# Patient Record
Sex: Female | Born: 1955 | Race: Black or African American | Hispanic: No | Marital: Married | Smoking: Never smoker
Health system: Southern US, Community
[De-identification: ages and names within clinical notes are randomized; demographics above are authoritative.]

## PROBLEM LIST (undated history)

## (undated) DIAGNOSIS — D369 Benign neoplasm, unspecified site: Secondary | ICD-10-CM

## (undated) DIAGNOSIS — A6000 Herpesviral infection of urogenital system, unspecified: Secondary | ICD-10-CM

## (undated) HISTORY — PX: TUBAL LIGATION: SHX77

---

## 2005-06-16 ENCOUNTER — Ambulatory Visit: Payer: Self-pay | Admitting: Family Medicine

## 2006-06-22 ENCOUNTER — Ambulatory Visit: Payer: Self-pay | Admitting: Family Medicine

## 2006-06-23 ENCOUNTER — Ambulatory Visit: Payer: Self-pay | Admitting: Gastroenterology

## 2008-01-20 HISTORY — PX: BREAST BIOPSY: SHX20

## 2008-03-14 ENCOUNTER — Ambulatory Visit: Payer: Self-pay | Admitting: Family Medicine

## 2008-04-17 ENCOUNTER — Ambulatory Visit: Payer: Self-pay | Admitting: Surgery

## 2010-06-20 ENCOUNTER — Ambulatory Visit: Payer: Self-pay | Admitting: Family Medicine

## 2011-06-30 ENCOUNTER — Ambulatory Visit: Payer: Self-pay | Admitting: Family Medicine

## 2012-06-30 ENCOUNTER — Ambulatory Visit: Payer: Self-pay | Admitting: Family Medicine

## 2013-08-22 ENCOUNTER — Ambulatory Visit: Payer: Self-pay | Admitting: Family Medicine

## 2014-10-23 ENCOUNTER — Other Ambulatory Visit: Payer: Self-pay | Admitting: Family Medicine

## 2014-10-23 DIAGNOSIS — Z1231 Encounter for screening mammogram for malignant neoplasm of breast: Secondary | ICD-10-CM

## 2014-11-08 ENCOUNTER — Ambulatory Visit
Admission: RE | Admit: 2014-11-08 | Discharge: 2014-11-08 | Disposition: A | Discharge status: Home / Self Care | Payer: BLUE CROSS/BLUE SHIELD | Source: Ambulatory Visit | Attending: Family Medicine | Admitting: Family Medicine

## 2014-11-08 DIAGNOSIS — Z1231 Encounter for screening mammogram for malignant neoplasm of breast: Secondary | ICD-10-CM | POA: Insufficient documentation

## 2014-12-17 ENCOUNTER — Encounter: Payer: Self-pay | Admitting: *Deleted

## 2014-12-18 ENCOUNTER — Encounter
Admission: RE | Disposition: A | Discharge status: Home / Self Care | Payer: Self-pay | Source: Ambulatory Visit | Attending: Gastroenterology

## 2014-12-18 ENCOUNTER — Encounter: Payer: Self-pay | Admitting: *Deleted

## 2014-12-18 ENCOUNTER — Ambulatory Visit
Admission: RE | Admit: 2014-12-18 | Discharge: 2014-12-18 | Disposition: A | Discharge status: Home / Self Care | Payer: BLUE CROSS/BLUE SHIELD | Source: Ambulatory Visit | Attending: Gastroenterology | Admitting: Gastroenterology

## 2014-12-18 ENCOUNTER — Ambulatory Visit: Payer: BLUE CROSS/BLUE SHIELD | Admitting: Anesthesiology

## 2014-12-18 DIAGNOSIS — Z800 Family history of malignant neoplasm of digestive organs: Secondary | ICD-10-CM | POA: Insufficient documentation

## 2014-12-18 DIAGNOSIS — D123 Benign neoplasm of transverse colon: Secondary | ICD-10-CM | POA: Diagnosis not present

## 2014-12-18 DIAGNOSIS — Z86018 Personal history of other benign neoplasm: Secondary | ICD-10-CM | POA: Diagnosis not present

## 2014-12-18 DIAGNOSIS — K648 Other hemorrhoids: Secondary | ICD-10-CM | POA: Insufficient documentation

## 2014-12-18 DIAGNOSIS — Z1211 Encounter for screening for malignant neoplasm of colon: Secondary | ICD-10-CM | POA: Insufficient documentation

## 2014-12-18 DIAGNOSIS — K5730 Diverticulosis of large intestine without perforation or abscess without bleeding: Secondary | ICD-10-CM | POA: Insufficient documentation

## 2014-12-18 DIAGNOSIS — D125 Benign neoplasm of sigmoid colon: Secondary | ICD-10-CM | POA: Insufficient documentation

## 2014-12-18 HISTORY — DX: Benign neoplasm, unspecified site: D36.9

## 2014-12-18 HISTORY — DX: Herpesviral infection of urogenital system, unspecified: A60.00

## 2014-12-18 HISTORY — PX: COLONOSCOPY WITH PROPOFOL: SHX5780

## 2014-12-18 SURGERY — COLONOSCOPY WITH PROPOFOL
Anesthesia: General

## 2014-12-18 MED ORDER — MIDAZOLAM HCL 5 MG/5ML IJ SOLN
INTRAMUSCULAR | Status: DC | PRN
  Administered 2014-12-18: 1 mg via INTRAVENOUS

## 2014-12-18 MED ORDER — PROPOFOL 10 MG/ML IV BOLUS
INTRAVENOUS | Status: DC | PRN
  Administered 2014-12-18: 60 mg via INTRAVENOUS

## 2014-12-18 MED ORDER — SODIUM CHLORIDE 0.9 % IV SOLN: INTRAVENOUS | Status: DC

## 2014-12-18 MED ORDER — LIDOCAINE HCL (CARDIAC) 20 MG/ML IV SOLN
INTRAVENOUS | Status: DC | PRN
Start: 2014-12-18 — End: 2014-12-18
  Administered 2014-12-18: 100 mg via INTRAVENOUS

## 2014-12-18 MED ORDER — FENTANYL CITRATE (PF) 100 MCG/2ML IJ SOLN
INTRAMUSCULAR | Status: DC | PRN
  Administered 2014-12-18: 50 ug via INTRAVENOUS

## 2014-12-18 MED ORDER — SODIUM CHLORIDE 0.9 % IV SOLN
INTRAVENOUS | Status: DC
  Administered 2014-12-18: 13:00:00 via INTRAVENOUS

## 2014-12-18 MED ORDER — PROPOFOL 500 MG/50ML IV EMUL
INTRAVENOUS | Status: DC | PRN
  Administered 2014-12-18: 120 ug/kg/min via INTRAVENOUS

## 2014-12-18 NOTE — Anesthesia Preprocedure Evaluation (Signed)
Anesthesia Evaluation  Patient identified by MRN, date of birth, ID band Patient awake    Reviewed: Allergy & Precautions, NPO status , Patient's Chart, lab work & pertinent test results  Airway Mallampati: II       Dental no notable dental hx. (+) Teeth Intact   Pulmonary neg pulmonary ROS,    Pulmonary exam normal        Cardiovascular Exercise Tolerance: Good negative cardio ROS   Rhythm:Regular Rate:Normal     Neuro/Psych    GI/Hepatic negative GI ROS, Neg liver ROS,   Endo/Other    Renal/GU negative Renal ROS     Musculoskeletal negative musculoskeletal ROS (+)   Abdominal Normal abdominal exam  (+)   Peds  Hematology negative hematology ROS (+)   Anesthesia Other Findings   Reproductive/Obstetrics negative OB ROS                             Anesthesia Physical Anesthesia Plan  ASA: II  Anesthesia Plan: General   Post-op Pain Management:    Induction: Intravenous  Airway Management Planned: Nasal Cannula  Additional Equipment:   Intra-op Plan:   Post-operative Plan:   Informed Consent: I have reviewed the patients History and Physical, chart, labs and discussed the procedure including the risks, benefits and alternatives for the proposed anesthesia with the patient or authorized representative who has indicated his/her understanding and acceptance.     Plan Discussed with: CRNA  Anesthesia Plan Comments:         Anesthesia Quick Evaluation

## 2014-12-18 NOTE — H&P (Signed)
Outpatient short stay form Pre-procedure 12/18/2014 1:30 PM Lollie Sails MD  Primary Physician: Dr. Shade Flood  Reason for visit:  Colonoscopy  History of present illness:  Patient is a 59 year old female presenting today for a screening colonoscopy. She has family history of colon cancer in primary relatives, father. She tolerated her prep well. She takes no aspirin products or blood thinning medications. Her last colonoscopy was in 2008 negative that time for polyps.    Current facility-administered medications:  .  0.9 %  sodium chloride infusion, , Intravenous, Continuous, Lollie Sails, MD, Last Rate: 20 mL/hr at 12/18/14 1313 .  0.9 %  sodium chloride infusion, , Intravenous, Continuous, Lollie Sails, MD  Prescriptions prior to admission  Medication Sig Dispense Refill Last Dose  . Multiple Vitamins-Minerals (WOMENS MULTI VITAMIN & MINERAL PO) Take 1 tablet by mouth daily.        No Known Allergies   Past Medical History  Diagnosis Date  . Intraductal papilloma   . Genital herpes     Review of systems:      Physical Exam    Heart and lungs: Regular rate and rhythm without rub or gallop, lungs are bilaterally clear.    HEENT: Normocephalic atraumatic eyes are anicteric.    Other:     Pertinant exam for procedure: Soft nontender nondistended bowel sounds positive normoactive.    Planned proceedures: Colonoscopy and indicated procedures. I have discussed the risks benefits and complications of procedures to include not limited to bleeding, infection, perforation and the risk of sedation and the patient wishes to proceed.    Lollie Sails, MD Gastroenterology 12/18/2014  1:30 PM

## 2014-12-18 NOTE — Op Note (Signed)
[facility] Gastroenterology Patient Name: Teresa Robinson Procedure Date: 12/18/2014 1:37 PM MRN: [id number] Account #: [id number] Date of Birth: Apr 22, 1955 Admit Type: Outpatient Age: 59 Room: [facility] ENDO ROOM 3 Gender: Female Note Status: Finalized Procedure:         Colonoscopy Indications:       Family history of colon cancer in a first-degree relative Providers:         Lollie Sails, MD Referring MD:      Turner Daniels, MD (Referring MD) Medicines:         Monitored Anesthesia Care Complications:     No immediate complications. Procedure:         Pre-Anesthesia Assessment:                    - ASA Grade Assessment: II - A patient with mild systemic                     disease.                    After obtaining informed consent, the colonoscope was                     passed under direct vision. Throughout the procedure, the                     patient's blood pressure, pulse, and oxygen saturations                     were monitored continuously. The Colonoscope was                     introduced through the anus and advanced to the the cecum,                     identified by appendiceal orifice and ileocecal valve. The                     colonoscopy was performed with moderate difficulty. The                     patient tolerated the procedure well. The quality of the                     bowel preparation was good. Findings:      A 6 mm polyp was found in the transverse colon. The polyp was       pedunculated. The polyp was removed with a hot snare. Resection and       retrieval were complete.      Two sessile polyps were found in the distal sigmoid colon. The polyps       were 1 to 2 mm in size. These polyps were removed with a cold biopsy       forceps. Resection and retrieval were complete.      Multiple small-mouthed diverticula were found in the sigmoid colon, in       the descending colon and in the transverse colon.      Non-bleeding internal  hemorrhoids were found during anoscopy. The       hemorrhoids were small.      The digital rectal exam was normal. Impression:        - One 6 mm polyp in the transverse colon. Resected and  retrieved.                    - Two 1 to 2 mm polyps in the distal sigmoid colon.                     Resected and retrieved.                    - Diverticulosis in the sigmoid colon, in the descending                     colon and in the transverse colon.                    - Non-bleeding internal hemorrhoids. Recommendation:    - Discharge patient to home.                    - Await pathology results.                    - Telephone GI clinic for pathology results in 1 week. Procedure Code(s): --- Professional ---                    [phone], Colonoscopy, flexible; with removal of tumor(s),                     polyp(s), or other lesion(s) by snare technique                    [id number], 90, Colonoscopy, flexible; with biopsy, single or                     multiple Diagnosis Code(s): --- Professional ---                    D12.3, Benign neoplasm of transverse colon                    D12.5, Benign neoplasm of sigmoid colon                    K64.8, Other hemorrhoids                    Z80.0, Family history of malignant neoplasm of digestive                     organs                    K57.30, Diverticulosis of large intestine without                     perforation or abscess without bleeding CPT copyright 2014 American Medical Association. All rights reserved. The codes documented in this report are preliminary and upon coder review may  be revised to meet current compliance requirements. Lollie Sails, MD 12/18/2014 2:13:19 PM This report has been signed electronically. Number of Addenda: 0 Note Initiated On: 12/18/2014 1:37 PM Scope Withdrawal Time: 0 hours 11 minutes 13 seconds  Total Procedure Duration: 0 hours 22 minutes 50 seconds       [facility]

## 2014-12-18 NOTE — Transfer of Care (Signed)
Immediate Anesthesia Transfer of Care Note  Patient: Teresa Robinson  Procedure(s) Performed: Procedure(s): COLONOSCOPY WITH PROPOFOL (N/A)  Patient Location: PACU  Anesthesia Type:General  Level of Consciousness: sedated  Airway & Oxygen Therapy: Patient Spontanous Breathing and Patient connected to nasal cannula oxygen  Post-op Assessment: Report given to RN and Post -op Vital signs reviewed and stable  Post vital signs: Reviewed and stable  Last Vitals:  Filed Vitals:   12/18/14 1249 12/18/14 1416  BP: 131/67 94/69  Pulse: 63 67  Temp: 36.2 C 36.4 C  Resp: 16 10    Complications: No apparent anesthesia complications

## 2014-12-18 NOTE — OR Nursing (Signed)
Patient was seen ar [facility] on 12/09/14 and is to return to work on 12/20/14.

## 2014-12-18 NOTE — OR Nursing (Signed)
MS. Teresa Robinson was seen at [facility] as a patient on 12/18/14. She can return to work on 12/20/14. Jeannie Done RN

## 2014-12-18 NOTE — Anesthesia Postprocedure Evaluation (Signed)
Anesthesia Post Note  Patient: Teresa Robinson  Procedure(s) Performed: Procedure(s) (LRB): COLONOSCOPY WITH PROPOFOL (N/A)  Patient location during evaluation: PACU Anesthesia Type: General Level of consciousness: awake Pain management: pain level controlled Vital Signs Assessment: post-procedure vital signs reviewed and stable Respiratory status: respiratory function stable Cardiovascular status: stable Anesthetic complications: no    Last Vitals:  Filed Vitals:   12/18/14 1424 12/18/14 1435  BP: 115/73 121/76  Pulse: 63 65  Temp:    Resp: 14 14    Last Pain: There were no vitals filed for this visit.               VAN STAVEREN,Jimmy Stipes

## 2014-12-19 ENCOUNTER — Encounter: Payer: Self-pay | Admitting: Gastroenterology

## 2014-12-20 LAB — SURGICAL PATHOLOGY

## 2015-10-25 ENCOUNTER — Other Ambulatory Visit: Payer: Self-pay | Admitting: Family Medicine

## 2015-10-25 DIAGNOSIS — Z1239 Encounter for other screening for malignant neoplasm of breast: Secondary | ICD-10-CM

## 2015-11-19 ENCOUNTER — Ambulatory Visit
Admission: RE | Admit: 2015-11-19 | Discharge: 2015-11-19 | Disposition: A | Discharge status: Home / Self Care | Payer: BLUE CROSS/BLUE SHIELD | Source: Ambulatory Visit | Attending: Family Medicine | Admitting: Family Medicine

## 2015-11-19 ENCOUNTER — Ambulatory Visit: Admission: RE | Admit: 2015-11-19 | Payer: BLUE CROSS/BLUE SHIELD | Source: Ambulatory Visit

## 2015-11-19 DIAGNOSIS — Z1231 Encounter for screening mammogram for malignant neoplasm of breast: Secondary | ICD-10-CM | POA: Diagnosis present

## 2015-11-19 DIAGNOSIS — Z1239 Encounter for other screening for malignant neoplasm of breast: Secondary | ICD-10-CM

## 2016-10-22 ENCOUNTER — Other Ambulatory Visit: Payer: Self-pay | Admitting: Family Medicine

## 2016-10-22 DIAGNOSIS — Z1231 Encounter for screening mammogram for malignant neoplasm of breast: Secondary | ICD-10-CM

## 2016-11-20 ENCOUNTER — Ambulatory Visit
Admission: RE | Admit: 2016-11-20 | Discharge: 2016-11-20 | Disposition: A | Discharge status: Home / Self Care | Payer: BLUE CROSS/BLUE SHIELD | Source: Ambulatory Visit | Attending: Family Medicine | Admitting: Family Medicine

## 2016-11-20 DIAGNOSIS — Z1231 Encounter for screening mammogram for malignant neoplasm of breast: Secondary | ICD-10-CM

## 2017-11-01 ENCOUNTER — Other Ambulatory Visit: Payer: Self-pay | Admitting: Family Medicine

## 2017-11-01 DIAGNOSIS — Z1231 Encounter for screening mammogram for malignant neoplasm of breast: Secondary | ICD-10-CM

## 2017-11-23 ENCOUNTER — Ambulatory Visit
Admission: RE | Admit: 2017-11-23 | Discharge: 2017-11-23 | Disposition: A | Discharge status: Home / Self Care | Payer: BLUE CROSS/BLUE SHIELD | Source: Ambulatory Visit | Attending: Family Medicine | Admitting: Family Medicine

## 2017-11-23 DIAGNOSIS — Z1231 Encounter for screening mammogram for malignant neoplasm of breast: Secondary | ICD-10-CM | POA: Insufficient documentation

## 2019-01-26 ENCOUNTER — Other Ambulatory Visit: Payer: Self-pay | Admitting: Family Medicine

## 2019-01-26 DIAGNOSIS — Z1231 Encounter for screening mammogram for malignant neoplasm of breast: Secondary | ICD-10-CM

## 2019-02-20 ENCOUNTER — Ambulatory Visit
Admission: RE | Admit: 2019-02-20 | Discharge: 2019-02-20 | Disposition: A | Discharge status: Home / Self Care | Payer: BC Managed Care – PPO | Source: Ambulatory Visit | Attending: Family Medicine | Admitting: Family Medicine

## 2019-02-20 DIAGNOSIS — Z1231 Encounter for screening mammogram for malignant neoplasm of breast: Secondary | ICD-10-CM | POA: Diagnosis present

## 2020-01-26 ENCOUNTER — Other Ambulatory Visit: Payer: Self-pay | Admitting: Student

## 2020-01-26 DIAGNOSIS — Z1231 Encounter for screening mammogram for malignant neoplasm of breast: Secondary | ICD-10-CM

## 2020-02-21 ENCOUNTER — Ambulatory Visit
Admission: RE | Admit: 2020-02-21 | Discharge: 2020-02-21 | Disposition: A | Discharge status: Home / Self Care | Payer: No Typology Code available for payment source | Source: Ambulatory Visit | Attending: Obstetrics and Gynecology | Admitting: Obstetrics and Gynecology

## 2020-02-21 ENCOUNTER — Other Ambulatory Visit: Payer: Self-pay

## 2020-02-21 DIAGNOSIS — Z1231 Encounter for screening mammogram for malignant neoplasm of breast: Secondary | ICD-10-CM | POA: Diagnosis not present

## 2020-12-26 ENCOUNTER — Telehealth: Payer: Self-pay

## 2020-12-26 NOTE — Telephone Encounter (Signed)
Inbound call from pt requesting to schedule her colonoscopy

## 2020-12-27 ENCOUNTER — Telehealth: Payer: Self-pay

## 2020-12-27 NOTE — Telephone Encounter (Signed)
Patient is ready to schedule procedure. Clinical staff will follow up with patient. °

## 2020-12-30 ENCOUNTER — Other Ambulatory Visit: Payer: Self-pay

## 2020-12-30 DIAGNOSIS — Z800 Family history of malignant neoplasm of digestive organs: Secondary | ICD-10-CM

## 2020-12-30 DIAGNOSIS — Z860100 Personal history of colon polyps, unspecified: Secondary | ICD-10-CM

## 2020-12-30 MED ORDER — NA SULFATE-K SULFATE-MG SULF 17.5-3.13-1.6 GM/177ML PO SOLN: 1.0000 | Freq: Once | ORAL | 0 refills | Status: AC

## 2020-12-30 NOTE — Progress Notes (Signed)
Gastroenterology Pre-Procedure Review  Request Date: 01/21/2021 Requesting Physician: Dr. Allen Norris  PATIENT REVIEW QUESTIONS: The patient responded to the following health history questions as indicated:    1. Are you having any GI issues? no 2. Do you have a personal history of Polyps? yes (12/18/2014 polyps removed.) 3. Do you have a family history of Colon Cancer or Polyps? yes (Sister- colon cancer) 4. Diabetes Mellitus? no 5. Joint replacements in the past 12 months?no 6. Major health problems in the past 3 months?no 7. Any artificial heart valves, MVP, or defibrillator?no    MEDICATIONS & ALLERGIES:    Patient reports the following regarding taking any anticoagulation/antiplatelet therapy:   Plavix, Coumadin, Eliquis, Xarelto, Lovenox, Pradaxa, Brilinta, or Effient? no Aspirin? no  Patient confirms/reports the following medications:  Current Outpatient Medications  Medication Sig Dispense Refill   Multiple Vitamins-Minerals (WOMENS MULTI VITAMIN & MINERAL PO) Take 1 tablet by mouth daily.     No current facility-administered medications for this visit.    Patient confirms/reports the following allergies:  No Known Allergies  No orders of the defined types were placed in this encounter.   AUTHORIZATION INFORMATION Primary Insurance: 1D#: Group #:  Secondary Insurance: 1D#: Group #:  SCHEDULE INFORMATION: Date: 01/21/2021 Time: Location: [facility]

## 2020-12-30 NOTE — Telephone Encounter (Signed)
Procedure scheduled for 01/21/21.

## 2020-12-31 ENCOUNTER — Other Ambulatory Visit: Payer: Self-pay | Admitting: Family Medicine

## 2020-12-31 DIAGNOSIS — Z1231 Encounter for screening mammogram for malignant neoplasm of breast: Secondary | ICD-10-CM

## 2021-01-17 ENCOUNTER — Other Ambulatory Visit: Payer: Self-pay

## 2021-01-21 ENCOUNTER — Ambulatory Visit: Payer: Medicare Other | Admitting: Anesthesiology

## 2021-01-21 ENCOUNTER — Encounter
Admission: RE | Disposition: A | Discharge status: Home / Self Care | Payer: Self-pay | Source: Home / Self Care | Attending: Gastroenterology

## 2021-01-21 ENCOUNTER — Encounter: Payer: Self-pay | Admitting: Gastroenterology

## 2021-01-21 ENCOUNTER — Ambulatory Visit
Admission: RE | Admit: 2021-01-21 | Discharge: 2021-01-21 | Disposition: A | Discharge status: Home / Self Care | Payer: Medicare Other | Attending: Gastroenterology | Admitting: Gastroenterology

## 2021-01-21 DIAGNOSIS — K635 Polyp of colon: Secondary | ICD-10-CM | POA: Insufficient documentation

## 2021-01-21 DIAGNOSIS — Z860100 Personal history of colon polyps, unspecified: Secondary | ICD-10-CM

## 2021-01-21 DIAGNOSIS — K640 First degree hemorrhoids: Secondary | ICD-10-CM | POA: Insufficient documentation

## 2021-01-21 DIAGNOSIS — Z1211 Encounter for screening for malignant neoplasm of colon: Secondary | ICD-10-CM | POA: Insufficient documentation

## 2021-01-21 DIAGNOSIS — Z800 Family history of malignant neoplasm of digestive organs: Secondary | ICD-10-CM | POA: Insufficient documentation

## 2021-01-21 HISTORY — PX: COLONOSCOPY WITH PROPOFOL: SHX5780

## 2021-01-21 SURGERY — COLONOSCOPY WITH PROPOFOL
Anesthesia: General

## 2021-01-21 MED ORDER — SODIUM CHLORIDE 0.9 % IV SOLN: INTRAVENOUS | Status: DC

## 2021-01-21 MED ORDER — PROPOFOL 10 MG/ML IV BOLUS
INTRAVENOUS | Status: DC | PRN
  Administered 2021-01-21: 70 mg via INTRAVENOUS

## 2021-01-21 MED ORDER — PROPOFOL 500 MG/50ML IV EMUL
INTRAVENOUS | Status: DC | PRN
  Administered 2021-01-21: 140 ug/kg/min via INTRAVENOUS

## 2021-01-21 NOTE — Op Note (Signed)
[facility] Gastroenterology Patient Name: Teresa Robinson Procedure Date: 01/21/2021 9:57 AM MRN: [id number] Account #: [id number] Date of Birth: Nov 27, 1955 Admit Type: Outpatient Age: 66 Room: [facility] ENDO ROOM 4 Gender: Female Note Status: Finalized Instrument Name: Park Meo [id number] Procedure:             Colonoscopy Indications:           High risk colon cancer surveillance: Personal history                         of colonic polyps, Family history of colon cancer in                         multiple second-degree relatives Providers:             Lucilla Lame MD, MD Referring MD:          [place]:             Propofol per Anesthesia Complications:         No immediate complications. Procedure:             Pre-Anesthesia Assessment:                        - Prior to the procedure, a History and Physical was                         performed, and patient medications and allergies were                         reviewed. The patient's tolerance of previous                         anesthesia was also reviewed. The risks and benefits                         of the procedure and the sedation options and risks                         were discussed with the patient. All questions were                         answered, and informed consent was obtained. Prior                         Anticoagulants: The patient has taken no previous                         anticoagulant or antiplatelet agents. ASA Grade                         Assessment: II - A patient with mild systemic disease.                         After reviewing the risks and benefits, the patient                         was deemed in satisfactory condition to undergo the  procedure.                        After obtaining informed consent, the colonoscope was                         passed under direct vision. Throughout the procedure,                         the patient's blood  pressure, pulse, and oxygen                         saturations were monitored continuously. The                         Colonoscope was introduced through the anus and                         advanced to the the cecum, identified by appendiceal                         orifice and ileocecal valve. The colonoscopy was                         performed without difficulty. The patient tolerated                         the procedure well. The quality of the bowel                         preparation was excellent. Findings:      The perianal and digital rectal examinations were normal.      A 3 mm polyp was found in the descending colon. The polyp was sessile.       The polyp was removed with a cold biopsy forceps. Resection and       retrieval were complete.      A 4 mm polyp was found in the sigmoid colon. The polyp was sessile. The       polyp was removed with a cold biopsy forceps. Resection and retrieval       were complete.      Non-bleeding internal hemorrhoids were found during retroflexion. The       hemorrhoids were Grade I (internal hemorrhoids that do not prolapse). Impression:            - One 3 mm polyp in the descending colon, removed with                         a cold biopsy forceps. Resected and retrieved.                        - One 4 mm polyp in the sigmoid colon, removed with a                         cold biopsy forceps. Resected and retrieved.                        - Non-bleeding internal hemorrhoids. Recommendation:        - Discharge patient to home.                        -  Resume previous diet.                        - Continue present medications.                        - Await pathology results.                        - Repeat colonoscopy in 5 years for surveillance. Procedure Code(s):     --- Professional ---                        [phone], Colonoscopy, flexible; with biopsy, single or                         multiple Diagnosis Code(s):     --- Professional  ---                        Z86.010, Personal history of colonic polyps                        K63.5, Polyp of colon CPT copyright 2019 American Medical Association. All rights reserved. The codes documented in this report are preliminary and upon coder review may  be revised to meet current compliance requirements. Lucilla Lame MD, MD 01/21/2021 10:31:25 AM This report has been signed electronically. Number of Addenda: 0 Note Initiated On: 01/21/2021 9:57 AM Scope Withdrawal Time: 0 hours 10 minutes 29 seconds  Total Procedure Duration: 0 hours 16 minutes 47 seconds  Estimated Blood Loss:  Estimated blood loss: none.      [facility]

## 2021-01-21 NOTE — Anesthesia Preprocedure Evaluation (Signed)
Anesthesia Evaluation  Patient identified by MRN, date of birth, ID band Patient awake    Reviewed: Allergy & Precautions, NPO status , Patient's Chart, lab work & pertinent test results  History of Anesthesia Complications Negative for: history of anesthetic complications  Airway Mallampati: III  TM Distance: >3 FB Neck ROM: full    Dental  (+) Chipped   Pulmonary neg pulmonary ROS, neg shortness of breath,    Pulmonary exam normal        Cardiovascular Exercise Tolerance: Good (-) angina(-) Past MI and (-) DOE negative cardio ROS Normal cardiovascular exam     Neuro/Psych negative neurological ROS  negative psych ROS   GI/Hepatic negative GI ROS, Neg liver ROS, neg GERD  ,  Endo/Other  negative endocrine ROS  Renal/GU negative Renal ROS  negative genitourinary   Musculoskeletal   Abdominal   Peds  Hematology negative hematology ROS (+)   Anesthesia Other Findings Past Medical History: No date: Genital herpes No date: Intraductal papilloma  Past Surgical History: 2010: BREAST BIOPSY; Right     Comment:  neg 12/18/2014: COLONOSCOPY WITH PROPOFOL; N/A     Comment:  Procedure: COLONOSCOPY WITH PROPOFOL;  Surgeon: Lollie Sails, MD;  Location: [facility] ENDOSCOPY;  Service:               Endoscopy;  Laterality: N/A; No date: TUBAL LIGATION  BMI    Body Mass Index: 33.63 kg/m      Reproductive/Obstetrics negative OB ROS                             Anesthesia Physical Anesthesia Plan  ASA: 2  Anesthesia Plan: General   Post-op Pain Management:    Induction: Intravenous  PONV Risk Score and Plan: Propofol infusion and TIVA  Airway Management Planned: Natural Airway and Nasal Cannula  Additional Equipment:   Intra-op Plan:   Post-operative Plan:   Informed Consent: I have reviewed the patients History and Physical, chart, labs and discussed the procedure  including the risks, benefits and alternatives for the proposed anesthesia with the patient or authorized representative who has indicated his/her understanding and acceptance.     Dental Advisory Given  Plan Discussed with: Anesthesiologist, CRNA and Surgeon  Anesthesia Plan Comments: (Patient consented for risks of anesthesia including but not limited to:  - adverse reactions to medications - risk of airway placement if required - damage to eyes, teeth, lips or other oral mucosa - nerve damage due to positioning  - sore throat or hoarseness - Damage to heart, brain, nerves, lungs, other parts of body or loss of life  Patient voiced understanding.)        Anesthesia Quick Evaluation

## 2021-01-21 NOTE — H&P (Signed)
° °  Lucilla Lame, MD [facility] [address], [place], [place] [address] [phone] Fax : [phone]  Primary Care Physician:  [facility] Primary Gastroenterologist:  Dr. Allen Norris  Pre-Procedure History & Physical: HPI:  Teresa Robinson is a 66 y.o. female is here for an colonoscopy.   Past Medical History:  Diagnosis Date   Genital herpes    Intraductal papilloma     Past Surgical History:  Procedure Laterality Date   BREAST BIOPSY Right 2010   neg   COLONOSCOPY WITH PROPOFOL N/A 12/18/2014   Procedure: COLONOSCOPY WITH PROPOFOL;  Surgeon: Lollie Sails, MD;  Location: [facility] ENDOSCOPY;  Service: Endoscopy;  Laterality: N/A;   TUBAL LIGATION      Prior to Admission medications   Medication Sig Start Date End Date Taking? Authorizing Provider  Multiple Vitamins-Minerals (WOMENS MULTI VITAMIN & MINERAL PO) Take 1 tablet by mouth daily.    [provider]    Allergies as of 12/30/2020   (No Known Allergies)    Family History  Problem Relation Age of Onset   Breast cancer Sister 25    Social History   Socioeconomic History   Marital status: Married    Spouse name: Not on file   Number of children: Not on file   Years of education: Not on file   Highest education level: Not on file  Occupational History   Not on file  Tobacco Use   Smoking status: Never   Smokeless tobacco: Not on file  Vaping Use   Vaping Use: Never used  Substance and Sexual Activity   Alcohol use: No   Drug use: No   Sexual activity: Not on file  Other Topics Concern   Not on file  Social History Narrative   Not on file   Social Determinants of Health   Financial Resource Strain: Not on file  Food Insecurity: Not on file  Transportation Needs: Not on file  Physical Activity: Not on file  Stress: Not on file  Social Connections: Not on file  Intimate Partner Violence: Not on file    Review of Systems: See HPI, otherwise negative  ROS  Physical Exam: BP 134/75    Pulse 64    Temp (!) 96.7 F (35.9 C) (Temporal)    Resp 16    Ht 5\' 1"  (1.549 m)    Wt 80.7 kg    SpO2 100%    BMI 33.63 kg/m  General:   Alert,  pleasant and cooperative in NAD Head:  Normocephalic and atraumatic. Neck:  Supple; no masses or thyromegaly. Lungs:  Clear throughout to auscultation.    Heart:  Regular rate and rhythm. Abdomen:  Soft, nontender and nondistended. Normal bowel sounds, without guarding, and without rebound.   Neurologic:  Alert and  oriented x4;  grossly normal neurologically.  Impression/Plan: Teresa Robinson is here for an colonoscopy to be performed for family history of colon cancer  Risks, benefits, limitations, and alternatives regarding  colonoscopy have been reviewed with the patient.  Questions have been answered.  All parties agreeable.   Lucilla Lame, MD  01/21/2021, 9:57 AM

## 2021-01-21 NOTE — Transfer of Care (Signed)
Immediate Anesthesia Transfer of Care Note  Patient: Teresa Robinson  Procedure(s) Performed: COLONOSCOPY WITH PROPOFOL  Patient Location: PACU  Anesthesia Type:general  Level of Consciousness: awake, alert  and oriented  Airway & Oxygen Therapy: Patient Spontanous Breathing  Post-op Assessment: Report given to RN and Post -op Vital signs reviewed and stable  Post vital signs: Reviewed and stable  Last Vitals:  Vitals Value Taken Time  BP 88/48 01/21/21 1033  Temp    Pulse 85 01/21/21 1035  Resp 13 01/21/21 1035  SpO2 99 % 01/21/21 1035  Vitals shown include unvalidated device data.  Last Pain:  Vitals:   01/21/21 0943  TempSrc: Temporal  PainSc: 0-No pain         Complications: No notable events documented.

## 2021-01-21 NOTE — Anesthesia Postprocedure Evaluation (Signed)
Anesthesia Post Note  Patient: Teresa Robinson  Procedure(s) Performed: COLONOSCOPY WITH PROPOFOL  Patient location during evaluation: Endoscopy Anesthesia Type: General Level of consciousness: awake and alert Pain management: pain level controlled Vital Signs Assessment: post-procedure vital signs reviewed and stable Respiratory status: spontaneous breathing, nonlabored ventilation, respiratory function stable and patient connected to nasal cannula oxygen Cardiovascular status: blood pressure returned to baseline and stable Postop Assessment: no apparent nausea or vomiting Anesthetic complications: no   No notable events documented.   Last Vitals:  Vitals:   01/21/21 1039 01/21/21 1053  BP: (!) 99/59 115/68  Pulse:    Resp:    Temp:    SpO2:      Last Pain:  Vitals:   01/21/21 1053  TempSrc:   PainSc: 0-No pain                 Precious Haws Jamison Soward

## 2021-01-22 ENCOUNTER — Encounter: Payer: Self-pay | Admitting: Gastroenterology

## 2021-01-22 LAB — SURGICAL PATHOLOGY

## 2021-01-24 ENCOUNTER — Encounter: Payer: Self-pay | Admitting: Gastroenterology

## 2021-02-21 ENCOUNTER — Ambulatory Visit
Admission: RE | Admit: 2021-02-21 | Discharge: 2021-02-21 | Disposition: A | Discharge status: Home / Self Care | Payer: Medicare Other | Source: Ambulatory Visit | Attending: Family Medicine | Admitting: Family Medicine

## 2021-02-21 ENCOUNTER — Other Ambulatory Visit: Payer: Self-pay

## 2021-02-21 DIAGNOSIS — Z1231 Encounter for screening mammogram for malignant neoplasm of breast: Secondary | ICD-10-CM | POA: Diagnosis present

## 2021-03-03 IMAGING — MG MM DIGITAL SCREENING BILAT W/ TOMO AND CAD
8 series · 8 of 24 positions shown · non-contrast
Comparison: Previous exam(s).

CLINICAL DATA: Screening.

EXAM:
DIGITAL SCREENING BILATERAL MAMMOGRAM WITH TOMO AND CAD

[L CC synth-2D]
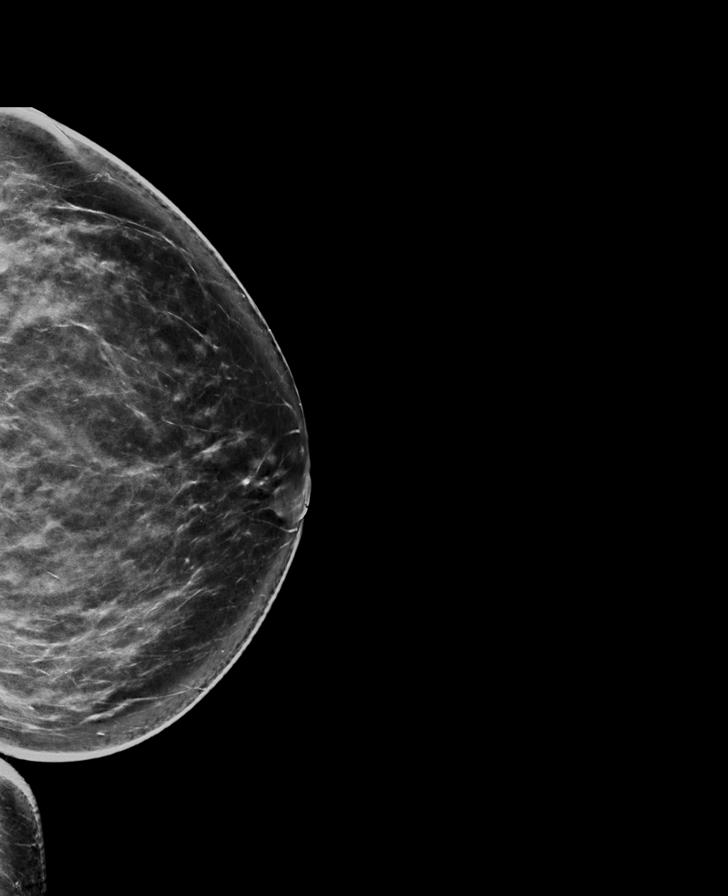

[L MLO synth-2D]
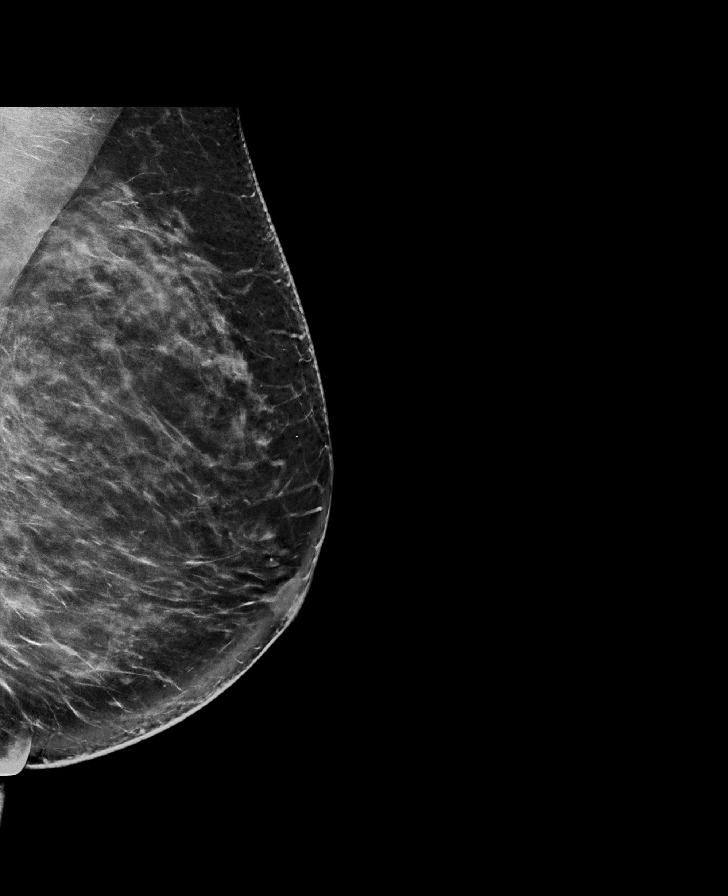

[R CC synth-2D]
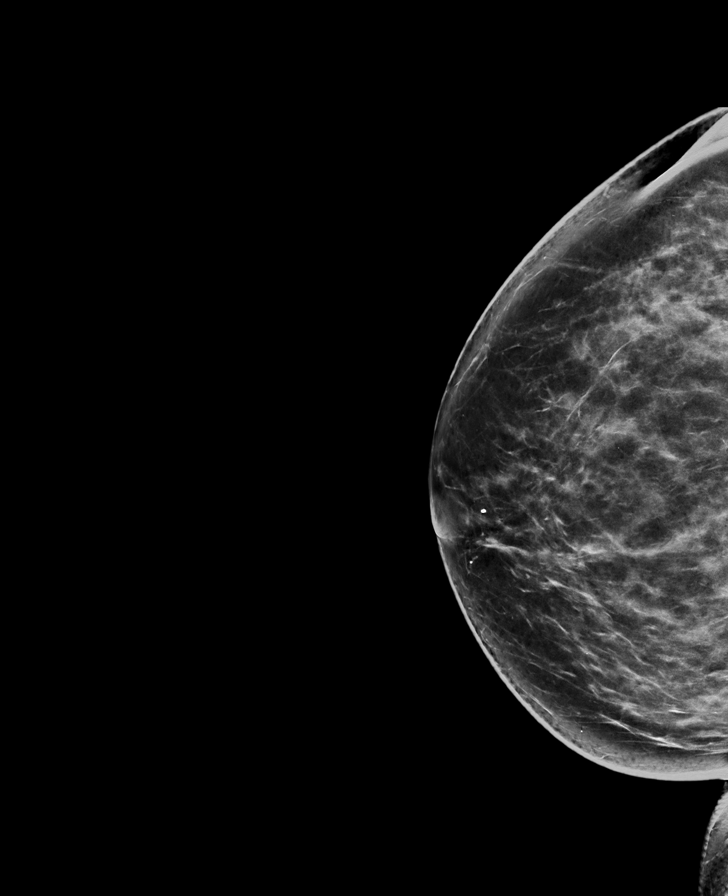

[R MLO synth-2D]
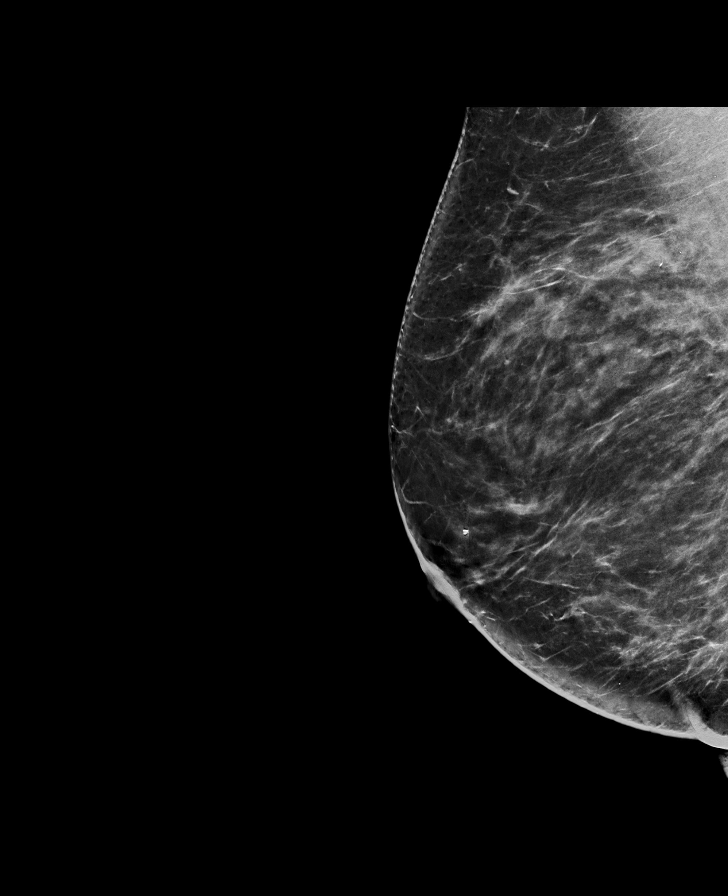

[L MLO tomo · tomo slice 46/91.0]
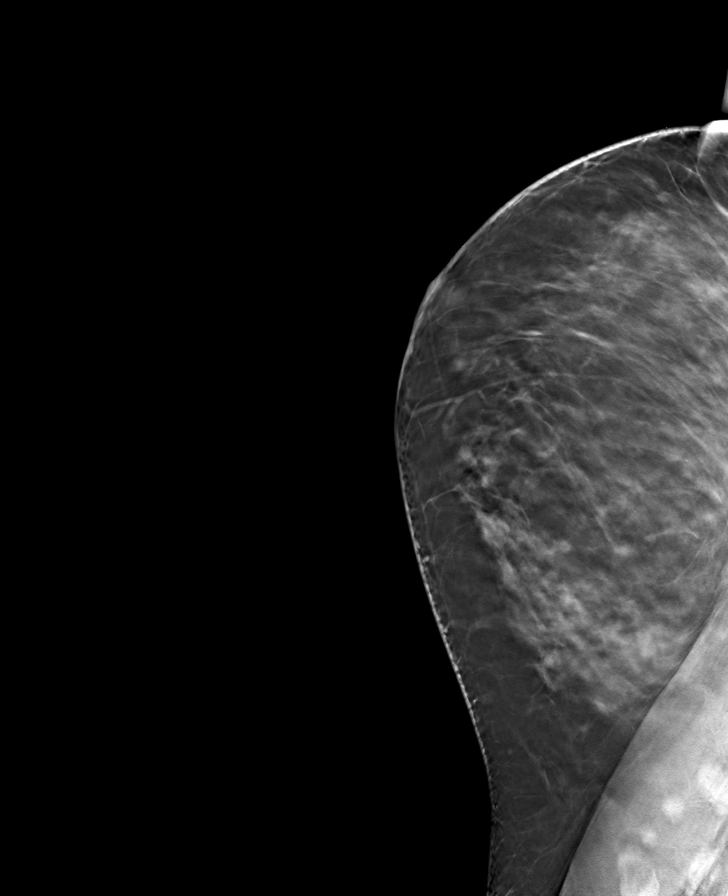

[R CC tomo · tomo slice 45/89.0]
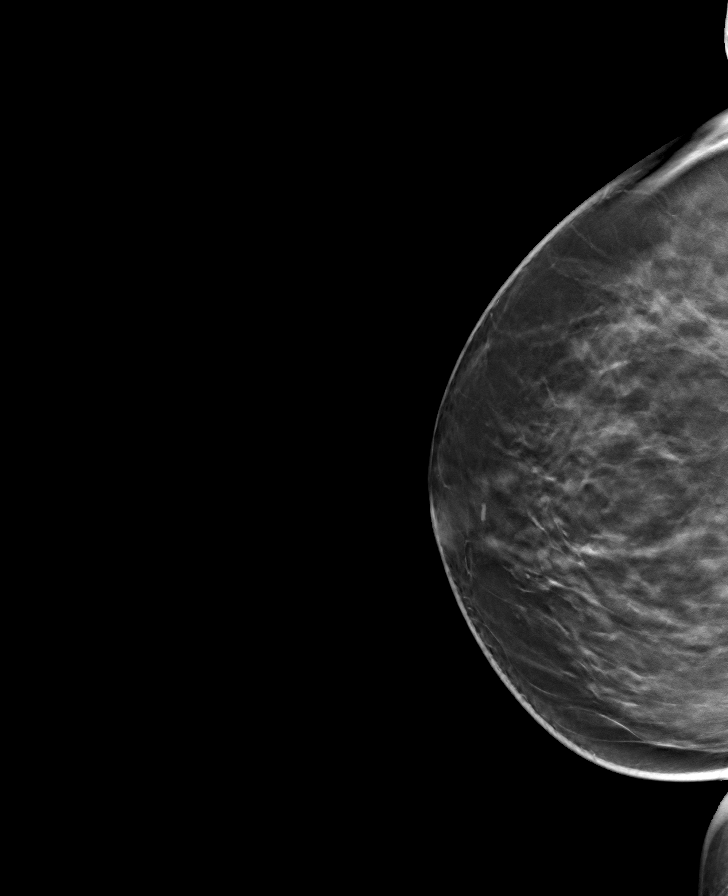

[L CC tomo · tomo slice 45/90.0]
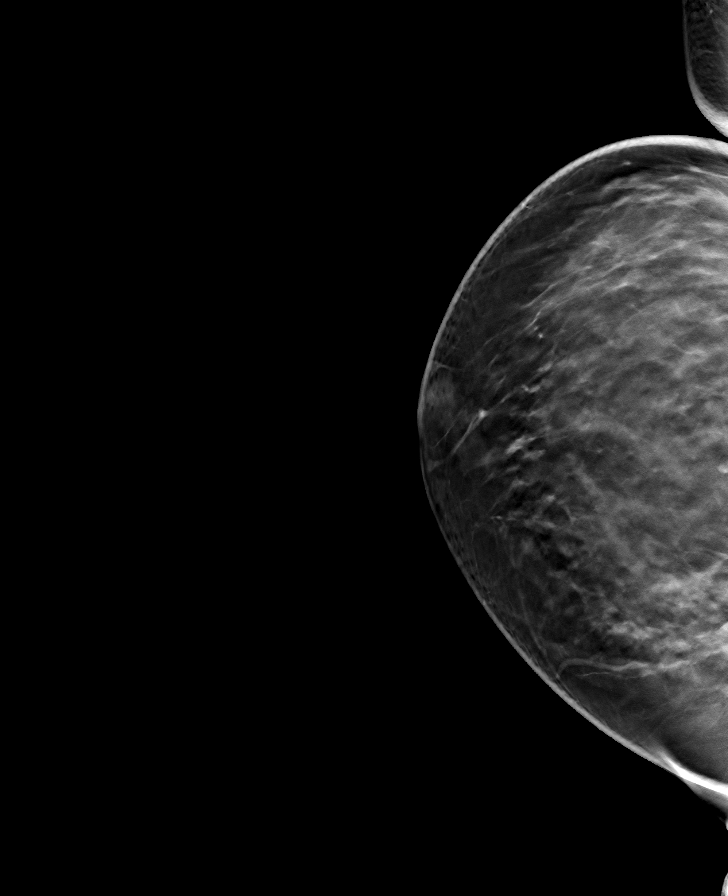

[R MLO tomo · tomo slice 45/88.0]
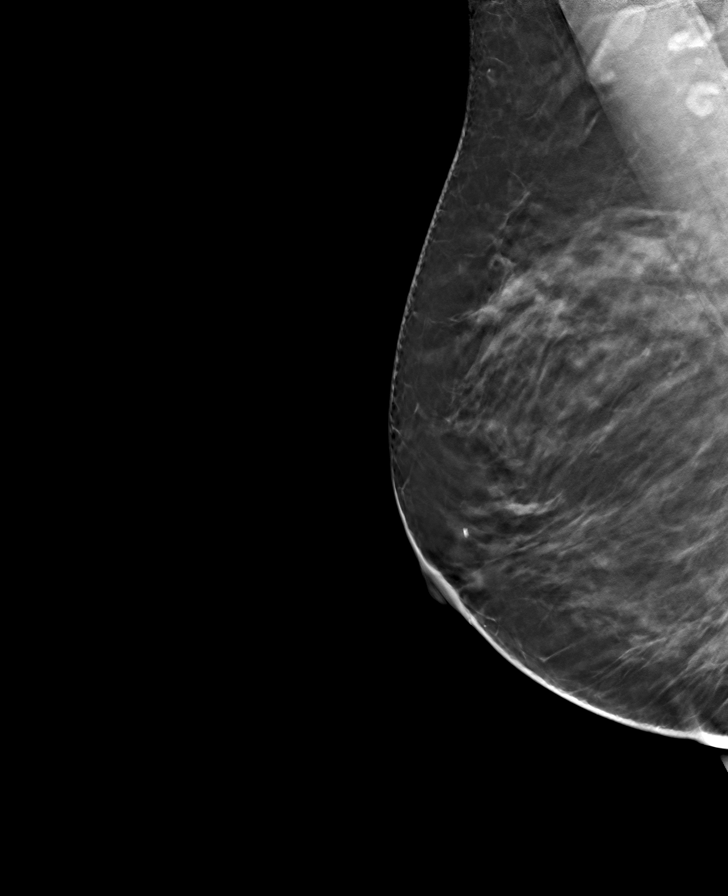

[8 of 24 positions shown; findings below may reference images not displayed]

ACR Breast Density Category c: The breast tissue is heterogeneously
dense, which may obscure small masses.
FINDINGS: There are no findings suspicious for malignancy. The images were
evaluated with computer-aided detection.
IMPRESSION: No mammographic evidence of malignancy. A result letter of this
screening mammogram will be mailed directly to the patient.

RECOMMENDATION:
Screening mammogram in one year. (Code:JF-W-WVL)

BI-RADS CATEGORY  1: Negative.

## 2021-11-26 ENCOUNTER — Other Ambulatory Visit: Payer: Self-pay | Admitting: Student

## 2021-11-26 DIAGNOSIS — Z780 Asymptomatic menopausal state: Secondary | ICD-10-CM

## 2022-02-05 ENCOUNTER — Other Ambulatory Visit: Payer: Medicare Other

## 2022-02-27 ENCOUNTER — Ambulatory Visit
Admission: RE | Admit: 2022-02-27 | Discharge: 2022-02-27 | Disposition: A | Discharge status: Home / Self Care | Payer: Medicare PPO | Source: Ambulatory Visit | Attending: Student | Admitting: Student

## 2022-02-27 ENCOUNTER — Ambulatory Visit
Admission: RE | Admit: 2022-02-27 | Discharge: 2022-02-27 | Disposition: A | Discharge status: Home / Self Care | Payer: Medicare PPO | Source: Ambulatory Visit | Attending: Family Medicine | Admitting: Family Medicine

## 2022-02-27 ENCOUNTER — Other Ambulatory Visit: Payer: Self-pay | Admitting: Family Medicine

## 2022-02-27 DIAGNOSIS — Z1231 Encounter for screening mammogram for malignant neoplasm of breast: Secondary | ICD-10-CM | POA: Diagnosis present

## 2022-02-27 DIAGNOSIS — Z780 Asymptomatic menopausal state: Secondary | ICD-10-CM | POA: Diagnosis present

## 2023-02-18 ENCOUNTER — Other Ambulatory Visit: Payer: Self-pay | Admitting: Family Medicine

## 2023-02-18 ENCOUNTER — Encounter: Payer: Self-pay | Admitting: Family Medicine

## 2023-02-18 DIAGNOSIS — Z1231 Encounter for screening mammogram for malignant neoplasm of breast: Secondary | ICD-10-CM

## 2023-03-03 ENCOUNTER — Ambulatory Visit
Admission: RE | Admit: 2023-03-03 | Discharge: 2023-03-03 | Disposition: A | Discharge status: Home / Self Care | Payer: Medicare PPO | Source: Ambulatory Visit | Attending: Family Medicine | Admitting: Family Medicine

## 2023-03-03 DIAGNOSIS — Z1231 Encounter for screening mammogram for malignant neoplasm of breast: Secondary | ICD-10-CM | POA: Insufficient documentation

## 2024-01-25 ENCOUNTER — Other Ambulatory Visit: Payer: Self-pay | Admitting: Family Medicine

## 2024-01-25 ENCOUNTER — Encounter: Payer: Self-pay | Admitting: Family Medicine

## 2024-01-25 DIAGNOSIS — Z1231 Encounter for screening mammogram for malignant neoplasm of breast: Secondary | ICD-10-CM

## 2024-03-08 ENCOUNTER — Encounter
